# Patient Record
Sex: Male | Born: 1940 | Race: White | Hispanic: No | Marital: Single | Smoking: Never smoker
Health system: Southern US, Community
[De-identification: ages and names within clinical notes are randomized; demographics above are authoritative.]

---

## 2013-10-07 ENCOUNTER — Emergency Department (HOSPITAL_COMMUNITY): Payer: Medicare Other

## 2013-10-07 ENCOUNTER — Encounter (HOSPITAL_COMMUNITY): Payer: Self-pay | Admitting: Emergency Medicine

## 2013-10-07 ENCOUNTER — Emergency Department (HOSPITAL_COMMUNITY)
Admission: EM | Admit: 2013-10-07 | Discharge: 2013-10-07 | Disposition: A | Discharge status: Home / Self Care | Payer: Medicare Other | Attending: Emergency Medicine | Admitting: Emergency Medicine

## 2013-10-07 DIAGNOSIS — R32 Unspecified urinary incontinence: Secondary | ICD-10-CM | POA: Insufficient documentation

## 2013-10-07 DIAGNOSIS — K439 Ventral hernia without obstruction or gangrene: Secondary | ICD-10-CM | POA: Insufficient documentation

## 2013-10-07 DIAGNOSIS — W06XXXA Fall from bed, initial encounter: Secondary | ICD-10-CM | POA: Insufficient documentation

## 2013-10-07 DIAGNOSIS — R531 Weakness: Secondary | ICD-10-CM

## 2013-10-07 DIAGNOSIS — Y9389 Activity, other specified: Secondary | ICD-10-CM | POA: Insufficient documentation

## 2013-10-07 DIAGNOSIS — R5383 Other fatigue: Principal | ICD-10-CM

## 2013-10-07 DIAGNOSIS — W19XXXA Unspecified fall, initial encounter: Secondary | ICD-10-CM

## 2013-10-07 DIAGNOSIS — F0390 Unspecified dementia without behavioral disturbance: Secondary | ICD-10-CM | POA: Insufficient documentation

## 2013-10-07 DIAGNOSIS — IMO0002 Reserved for concepts with insufficient information to code with codable children: Secondary | ICD-10-CM | POA: Insufficient documentation

## 2013-10-07 DIAGNOSIS — Y929 Unspecified place or not applicable: Secondary | ICD-10-CM | POA: Insufficient documentation

## 2013-10-07 DIAGNOSIS — R5381 Other malaise: Secondary | ICD-10-CM | POA: Insufficient documentation

## 2013-10-07 LAB — URINE MICROSCOPIC-ADD ON

## 2013-10-07 LAB — CBC WITH DIFFERENTIAL/PLATELET
Basophils Absolute: 0 10*3/uL (ref 0.0–0.1)
Basophils Relative: 1 % (ref 0–1)
EOS ABS: 0.2 10*3/uL (ref 0.0–0.7)
Eosinophils Relative: 2 % (ref 0–5)
HCT: 37.9 % — ABNORMAL LOW (ref 39.0–52.0)
HEMOGLOBIN: 13.3 g/dL (ref 13.0–17.0)
LYMPHS PCT: 13 % (ref 12–46)
Lymphs Abs: 1 10*3/uL (ref 0.7–4.0)
MCH: 30 pg (ref 26.0–34.0)
MCHC: 35.1 g/dL (ref 30.0–36.0)
MCV: 85.4 fL (ref 78.0–100.0)
MONOS PCT: 7 % (ref 3–12)
Monocytes Absolute: 0.5 10*3/uL (ref 0.1–1.0)
NEUTROS PCT: 77 % (ref 43–77)
Neutro Abs: 5.7 10*3/uL (ref 1.7–7.7)
Platelets: 171 10*3/uL (ref 150–400)
RBC: 4.44 MIL/uL (ref 4.22–5.81)
RDW: 14.7 % (ref 11.5–15.5)
WBC: 7.3 10*3/uL (ref 4.0–10.5)

## 2013-10-07 LAB — COMPREHENSIVE METABOLIC PANEL
ALK PHOS: 77 U/L (ref 39–117)
ALT: 15 U/L (ref 0–53)
AST: 19 U/L (ref 0–37)
Albumin: 3.8 g/dL (ref 3.5–5.2)
BILIRUBIN TOTAL: 0.3 mg/dL (ref 0.3–1.2)
BUN: 22 mg/dL (ref 6–23)
CHLORIDE: 104 meq/L (ref 96–112)
CO2: 25 meq/L (ref 19–32)
Calcium: 9.2 mg/dL (ref 8.4–10.5)
Creatinine, Ser: 1 mg/dL (ref 0.50–1.35)
GFR, EST AFRICAN AMERICAN: 85 mL/min — AB (ref >90)
GFR, EST NON AFRICAN AMERICAN: 74 mL/min — AB (ref >90)
GLUCOSE: 199 mg/dL — AB (ref 70–99)
Potassium: 5.1 mEq/L (ref 3.7–5.3)
Sodium: 142 mEq/L (ref 137–147)
TOTAL PROTEIN: 7.1 g/dL (ref 6.0–8.3)

## 2013-10-07 LAB — URINALYSIS, ROUTINE W REFLEX MICROSCOPIC
BILIRUBIN URINE: NEGATIVE
Glucose, UA: NEGATIVE mg/dL
KETONES UR: NEGATIVE mg/dL
LEUKOCYTES UA: NEGATIVE
NITRITE: NEGATIVE
Protein, ur: 30 mg/dL — AB
Specific Gravity, Urine: 1.028 (ref 1.005–1.030)
UROBILINOGEN UA: 0.2 mg/dL (ref 0.0–1.0)
pH: 5.5 (ref 5.0–8.0)

## 2013-10-07 NOTE — Progress Notes (Signed)
CARE MANAGEMENT ED NOTE 10/08/2013  Patient:  Derek Murillo,Derek Murillo   Account Number:  [id number]  Date Initiated:  10/07/2013  Documentation initiated by:  [facility]OGERS,Dezmen Alcock  Subjective/Objective Assessment:   [facility] weakness and Fall     Subjective/Objective Assessment Detail:     Action/Plan:   increasing HH services to assist with home health needs   Action/Plan Detail:   Anticipated DC Date:  10/07/2013     Status Recommendation to Physician:   Result of Recommendation:  Agreed  Other ED Services  Consult Working Plan   In-house referral  Clinical Social Worker   DC Associate Professorlanning Services  CM consult   [facility]  HOME HEALTH   Choice offered to / List presented to:  C-3 Spouse     HH arranged  HH-1 Theatre stage managerN  HH-2 PT  HH-6 SOCIAL WORKER  HH-3 OT      [facility] agency  [facility]    Status of service:  Completed, signed off  ED Comments:   ED Comments Detail:  4/4/ 1240 W. Aundria RudRogers RN BSN NCM 336 [phone] Christiana FuchsContacted Allison [phone] at [facility] to follow up on referral. She states, [facility] will follow up with patient and family on Monday 4/6. Attempted to notify patient's family to follow up,  no answer VM not set up.  10/07/13 1343  W. Brazos Sandoval RN BSN NCM 336 [phone] V/S WNL, UA,CBC ,CMP unremarkable, CT head and spine no acute abnormalities. ED evaluation unremarkable.Plan to discharge home with continued HH orders. Offered choice wife states, [facility] are [facility] will continue. Discussed the Recommendation for RN,PT/OT and SW, wife agrees, E. West and Dr. Effie ShyWentz agrees with plan. Referral placed by fax [phone]. Fax confirmation received. Provided wife with my contact information  Should she have any further concerns or questions. Wife verbalizes understanding.  10/07/13 12:57 W. Aundria RudRogers RN BSN NCM 336 [phone] [facility] to [facility] [phone] left message for on-call person, for return call , maybe closed for Good  Friday holiday.  10/07/13 1223 W. Aundria RudRogers RN BSN NCM 336 [phone] Wife Chip BoerVicki arrived and is at bedside. ED NCM and ED CSW spoke with wife. She states, husband lives with son and is [facility]. She unaware of exact service but knows a nurse come out a couple of times per week. She confirms patient has equipment walker, bedside commode. She reports that her son is presently seeking placement.  ED CSW and NCM explained to wife that if patient is does not meet medical necessity for inpatient admission,he will be discharged to family, and they could initiate process of placement from home with working with their PCP.  She verbalized understanding, and is agrees with plan to take him home. Discussed plan with E.. West PA-C, she agrees with plan. Evaluation still pending.  10/07/13 1119 W. Love Chowning RN BSN [phone] ED CM received consult from E. ChadWest PA-C concerning [facility] at request of Family. Pt  [facility] and fall. As per EMS son reports patient has been weak, hasn't "been himself, is falling a lot States he can't take care of him anymore". Pt has hx of dementia and is presently oriented to self but does not know who is the Economistresident.  As per EMS [facility]  [facility]. ED CSW attempted  to contact family, non working numbers. EDP medical evaluation pending.

## 2013-10-07 NOTE — ED Provider Notes (Signed)
CSN: [id number]     Arrival date & time 10/07/13  [id number] History   First MD Initiated Contact with Patient 10/07/13 [phone]     Chief Complaint  Patient presents with  . Fall  . Weakness     (Consider location/radiation/quality/duration/timing/severity/associated sxs/prior Treatment) Patient is a 73 y.o. male presenting with fall and weakness. The history is provided by the EMS personnel and the patient.  Fall Associated symptoms include weakness.  Weakness Associated symptoms include weakness.   Patient with hx dementia brought in by EMS.  EMS states son reports patient has been weak, hasn't "been himself," is falling a lot.  States he can't take care of him anymore.  Also has had strong smelling urine and has been incontinent of urine.  Pt lives at home with sons.   [facility].   Pt denies any pain, denies SOB, vomiting, any symptoms.  States he feels fine.  States he fell because he got out of bed and was off balance.    Level V caveat for dementia.   Per family member, patient actually fell yesterday not today and son wanted patient reevaluated.    No past medical history on file. No past surgical history on file. No family history on file. History  Substance Use Topics  . Smoking status: Never Smoker   . Smokeless tobacco: Not on file  . Alcohol Use: Yes    Review of Systems  Unable to perform ROS: Dementia  Neurological: Positive for weakness.      Allergies  Review of patient's allergies indicates no known allergies.  Home Medications  No current outpatient prescriptions on file. Ht 5\' 7"  (1.702 m)  Wt 205 lb (92.987 kg)  BMI 32.10 kg/m2  SpO2 95% Physical Exam  Nursing note and vitals reviewed. Constitutional: He appears well-developed and well-nourished. No distress.  HENT:  Head: Normocephalic and atraumatic.  Eyes: Conjunctivae are normal.  Neck: Neck supple.  Cardiovascular: Normal rate and regular rhythm.    Pulmonary/Chest: Effort normal and breath sounds normal. No respiratory distress. He has no wheezes. He has no rales.  Abdominal: Soft. He exhibits no distension and no mass. There is no tenderness. There is no rigidity, no rebound and no guarding. A hernia is present. Hernia confirmed positive in the ventral area.  Neurological: He is alert. He exhibits normal muscle tone.  Moves all extremities equally.  Oriented to self.  Knows [place].  Does not know the date or the president.    Skin: He is not diaphoretic.  Bilateral knee abrasions.  No erythema, edema, discharge.     ED Course  Procedures (including critical care time) Labs Review Labs Reviewed  CBC WITH DIFFERENTIAL - Abnormal; Notable for the following:    HCT 37.9 (*)    All other components within normal limits  COMPREHENSIVE METABOLIC PANEL - Abnormal; Notable for the following:    Glucose, Bld 199 (*)    GFR calc non Af Amer 74 (*)    GFR calc Af Amer 85 (*)    All other components within normal limits  URINALYSIS, ROUTINE W REFLEX MICROSCOPIC - Abnormal; Notable for the following:    Hgb urine dipstick TRACE (*)    Protein, ur 30 (*)    All other components within normal limits  URINE MICROSCOPIC-ADD ON - Abnormal; Notable for the following:    Bacteria, UA FEW (*)    Casts HYALINE  CASTS (*)    All other components within normal limits   Imaging Review Dg Chest 2 View  10/07/2013   CLINICAL DATA:  Larey Seat  EXAM: CHEST - 2 VIEW  COMPARISON:  10/06/2013 and earlier studies  FINDINGS: Peribronchial thickening and interstitial prominence in the right infrahilar region as before. Left lung clear. Heart size normal. . No effusion. Visualized skeletal structures are unremarkable.  IMPRESSION: Stable chronic right infrahilar peribronchial disease. No acute abnormality   Electronically Signed   By: Oley Balm M.D.   On: 10/07/2013 10:37   Ct Head Wo Contrast  10/07/2013   CLINICAL DATA:  Larey Seat   EXAM: CT HEAD WITHOUT CONTRAST  CT CERVICAL SPINE WITHOUT CONTRAST  TECHNIQUE: Multidetector CT imaging of the head and cervical spine was performed following the standard protocol without intravenous contrast. Multiplanar CT image reconstructions of the cervical spine were also generated.  COMPARISON:  04/16/2012  FINDINGS: CT HEAD FINDINGS  Atherosclerotic and physiologic intracranial calcifications. Previous left frontal and parietal craniotomy defects. Stable small left thalamic lacunar infarct. Diffuse parenchymal atrophy. Patchy areas of hypoattenuation in deep and periventricular white matter bilaterally. Negative for acute intracranial hemorrhage, mass lesion, acute infarction, midline shift, or mass-effect. Acute infarct may be inapparent on noncontrast CT. Mild asymmetry of lateral ventricles left greater than right as before. Bone windows demonstrate no acute lesion.  CT CERVICAL SPINE FINDINGS  Normal alignment. No prevertebral soft tissue swelling. Mild narrowing of interspaces C3-T1 with small anterior and posterior endplate spurs at all levels. Facets are seated. Negative for fracture. Visualized lung apices clear. Bilateral calcified carotid bifurcation plaque.  IMPRESSION: 1. Negative for bleed or other acute intracranial process. 2. Negative for cervical fracture or dislocation. 3. Postop changes, parenchymal atrophy, and nonspecific white matter changes as before. 4. Degenerative disc disease C3-T1.   Electronically Signed   By: Oley Balm M.D.   On: 10/07/2013 11:08   Ct Cervical Spine Wo Contrast  10/07/2013   CLINICAL DATA:  Larey Seat  EXAM: CT HEAD WITHOUT CONTRAST  CT CERVICAL SPINE WITHOUT CONTRAST  TECHNIQUE: Multidetector CT imaging of the head and cervical spine was performed following the standard protocol without intravenous contrast. Multiplanar CT image reconstructions of the cervical spine were also generated.  COMPARISON:  04/16/2012  FINDINGS: CT HEAD FINDINGS  Atherosclerotic  and physiologic intracranial calcifications. Previous left frontal and parietal craniotomy defects. Stable small left thalamic lacunar infarct. Diffuse parenchymal atrophy. Patchy areas of hypoattenuation in deep and periventricular white matter bilaterally. Negative for acute intracranial hemorrhage, mass lesion, acute infarction, midline shift, or mass-effect. Acute infarct may be inapparent on noncontrast CT. Mild asymmetry of lateral ventricles left greater than right as before. Bone windows demonstrate no acute lesion.  CT CERVICAL SPINE FINDINGS  Normal alignment. No prevertebral soft tissue swelling. Mild narrowing of interspaces C3-T1 with small anterior and posterior endplate spurs at all levels. Facets are seated. Negative for fracture. Visualized lung apices clear. Bilateral calcified carotid bifurcation plaque.  IMPRESSION: 1. Negative for bleed or other acute intracranial process. 2. Negative for cervical fracture or dislocation. 3. Postop changes, parenchymal atrophy, and nonspecific white matter changes as before. 4. Degenerative disc disease C3-T1.   Electronically Signed   By: Oley Balm M.D.   On: 10/07/2013 11:08     EKG Interpretation   Date/Time:  Friday October 07 2013 09:51:21 EDT Ventricular Rate:  80 PR Interval:  143 QRS Duration: 89 QT Interval:  388 QTC Calculation: 448 R Axis:   62 Text  Interpretation:  Sinus rhythm Low voltage, extremity leads Baseline  wander in lead(s) II III aVF No old tracing to compare Confirmed by [facility]   MD, ELLIOTT [phone]) on 10/07/2013 10:21:55 AM      10:24 AM Dr Effie Shy made aware of the patient.  I have also asked the secretary to attempt to get records from [facility] as we have no medical history available to [place] and family is currently not present.    10:41 AM Discussed pt with Care Manager  11:33 AM Notes reviewed from [facility].  PMH CVA, dementia (alzheimers), HTN, CHF, MI, hyperlipidemia, COPD, kidney stones, DM.   [facility] notes that patient was evaluated for fall at home, son wanted patient to be admitted so that his transition to [facility] would be paid for (otherwise would be $8,000), patient did not meet criteria for admission to hospital on this ED visit 10/06/13.   12:07 PM Son's mother is now bedside, I discussed all results with her and she agrees with d/c home with home health.  Appears pt has minimal home health at present, case management working to see what else we can offer patient.  Pt has wheelchair and walker at this time.   Case managers Burna Mortimer and Tyler Aas have worked with family member to provide assistance needed at home.    Filed Vitals:   10/07/13 1351  BP: 126/57  Pulse: 70  Temp: 98.5 F (36.9 C)  Resp: 16     MDM   Final diagnoses:  Generalized weakness  Fall    Pt with dementia brought in from home for generalized weakness, multiple recent falls. Evaluated at [facility] yesterday for fall, family wanted patient to be reevaluated here.  No criteria for admission.  Pt has wounds on his knees that do not appear infected, were addressed yesterday by [facility].  Pt seen by case managers who were able to help increase home health for the patient.  D/C home.  PCP follow up as needed.  Home Health will include RN, PT, OT, SW.      Trixie Dredge, PA-C 10/07/13 1513

## 2013-10-07 NOTE — Discharge Instructions (Signed)
Read the information below.  You may return to the Emergency Department at any time for worsening condition or any new symptoms that concern you.     Fall Prevention and Home Safety Falls cause injuries and can affect all age groups. It is possible to use preventive measures to significantly decrease the likelihood of falls. There are many simple measures which can make your home safer and prevent falls. OUTDOORS  Repair cracks and edges of walkways and driveways.  Remove high doorway thresholds.  Trim shrubbery on the main path into your home.  Have good outside lighting.  Clear walkways of tools, rocks, debris, and clutter.  Check that handrails are not broken and are securely fastened. Both sides of steps should have handrails.  Have leaves, snow, and ice cleared regularly.  Use sand or salt on walkways during winter months.  In the garage, clean up grease or oil spills. BATHROOM  Install night lights.  Install grab bars by the toilet and in the tub and shower.  Use non-skid mats or decals in the tub or shower.  Place a plastic non-slip stool in the shower to sit on, if needed.  Keep floors dry and clean up all water on the floor immediately.  Remove soap buildup in the tub or shower on a regular basis.  Secure bath mats with non-slip, double-sided rug tape.  Remove throw rugs and tripping hazards from the floors. BEDROOMS  Install night lights.  Make sure a bedside light is easy to reach.  Do not use oversized bedding.  Keep a telephone by your bedside.  Have a firm chair with side arms to use for getting dressed.  Remove throw rugs and tripping hazards from the floor. KITCHEN  Keep handles on pots and pans turned toward the center of the stove. Use back burners when possible.  Clean up spills quickly and allow time for drying.  Avoid walking on wet floors.  Avoid hot utensils and knives.  Position shelves so they are not too high or low.  Place  commonly used objects within easy reach.  If necessary, use a sturdy step stool with a grab bar when reaching.  Keep electrical cables out of the way.  Do not use floor polish or wax that makes floors slippery. If you must use wax, use non-skid floor wax.  Remove throw rugs and tripping hazards from the floor. STAIRWAYS  Never leave objects on stairs.  Place handrails on both sides of stairways and use them. Fix any loose handrails. Make sure handrails on both sides of the stairways are as long as the stairs.  Check carpeting to make sure it is firmly attached along stairs. Make repairs to worn or loose carpet promptly.  Avoid placing throw rugs at the top or bottom of stairways, or properly secure the rug with carpet tape to prevent slippage. Get rid of throw rugs, if possible.  Have an electrician put in a light switch at the top and bottom of the stairs. OTHER FALL PREVENTION TIPS  Wear low-heel or rubber-soled shoes that are supportive and fit well. Wear closed toe shoes.  When using a stepladder, make sure it is fully opened and both spreaders are firmly locked. Do not climb a closed stepladder.  Add color or contrast paint or tape to grab bars and handrails in your home. Place contrasting color strips on first and last steps.  Learn and use mobility aids as needed. Install an electrical emergency response system.  Turn on lights  to avoid dark areas. Replace light bulbs that burn out immediately. Get light switches that glow.  Arrange furniture to create clear pathways. Keep furniture in the same place.  Firmly attach carpet with non-skid or double-sided tape.  Eliminate uneven floor surfaces.  Select a carpet pattern that does not visually hide the edge of steps.  Be aware of all pets. OTHER HOME SAFETY TIPS  Set the water temperature for 120 F (48.8 C).  Keep emergency numbers on or near the telephone.  Keep smoke detectors on every level of the home and near  sleeping areas. Document Released: 06/13/2002 Document Revised: 12/23/2011 Document Reviewed: 09/12/2011 [facility] Patient Information 2014 [place] [place].

## 2013-10-07 NOTE — ED Notes (Signed)
Received pt from home with c/o pt fell this morning getting out of bed. Per EMS son reports that pt has had multiple falls and thinks pt may have UTI. Pt incontinent with strong odor in urine. Pt seen at another facility earlier this week for same. Pt present with wounds to B/L knees and has been seen by another Dr for them.

## 2013-10-07 NOTE — ED Provider Notes (Signed)
  Face-to-face evaluation   History: Elderly, male, who states he fell getting out of bed this morning. He complains only of injury to his knees. He has apparently had other falls, and was evaluated for same yesterday at an outlying facility.  Physical exam: Elderly man in, obese, cooperative, and mild pain. Normal active and passive range of motion of arms and legs, bilaterally. Minor abrasions anterior knees bilaterally.  Medical screening examination/treatment/procedure(s) were conducted as a shared visit with non-physician practitioner(s) and myself.  I personally evaluated the patient during the encounter  Flint MelterElliott L Leanna Hamid, MD 10/07/13 1600

## 2013-10-07 NOTE — ED Notes (Signed)
Patient transported to X-ray 

## 2013-10-10 NOTE — Progress Notes (Addendum)
  10/10/13 1206 W. Georgia Baria RN BSN 336 [phone] Marice PotterContacted [name] from [facility] [phone] she states, she is unable to provide [facility] needs at this time.     10/10/13 10:30 W. Aundria RudRogers RN BSN [phone] ED CM received VM this morning from [name] at [facility] [phone] unable to take this case for [facility] needs.  Attempted to (son) [name] at [phone] unable to LVM also attempted to reach wife [name] at [phone] left VM to return my call. ED CM will continue to contact family to offer choice of HH agency.

## 2013-10-11 NOTE — Progress Notes (Addendum)
Placed out going call to Derek Murillo ( patients son) [phone].Role of CM explained.CM reported to Derek Murillo that she  Attempted to call him yesterday but voice mail option was not set up.Explained that CM department had received a call [facility] were unable to provide home health services.Son reports he would like Home health set up with another agency.CHOICE List provided over the phone.Derek Murillo [facility].CM reports she will set up services.

## 2013-10-11 NOTE — Progress Notes (Signed)
VM left for lee patients son to confirm best contact name / number for patients [facility] referral CM contact number provided.

## 2015-01-02 IMAGING — CT CT HEAD W/O CM
4 of 6 series · 17 of 47 positions shown, 19 images · non-contrast
Comparison: 04/16/2012

CLINICAL DATA: Fell

EXAM:
CT HEAD WITHOUT CONTRAST
CT CERVICAL SPINE WITHOUT CONTRAST
TECHNIQUE: Multidetector CT imaging of the head and cervical spine was
performed following the standard protocol without intravenous
contrast. Multiplanar CT image reconstructions of the cervical spine
were also generated.

[Series 3: head w/o bone · axial · non-contrast · 0.49mm/px · z∈[+230,+308]mm · 3 of 63 slices shown]
[im 16/63  bone]
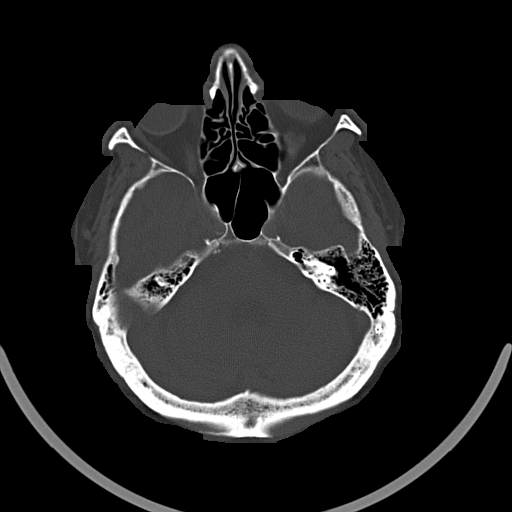
[im 32/63  bone]
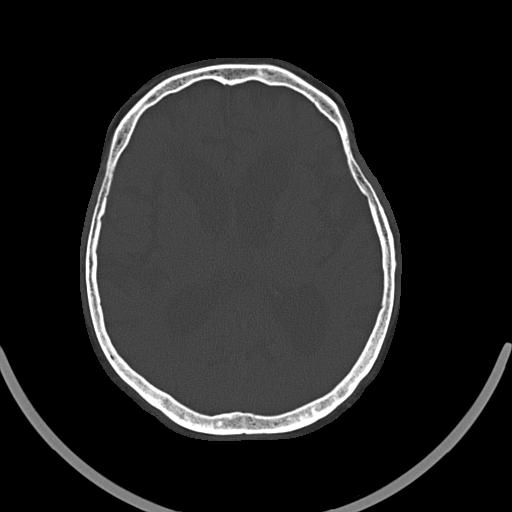
[im 47/63  bone]
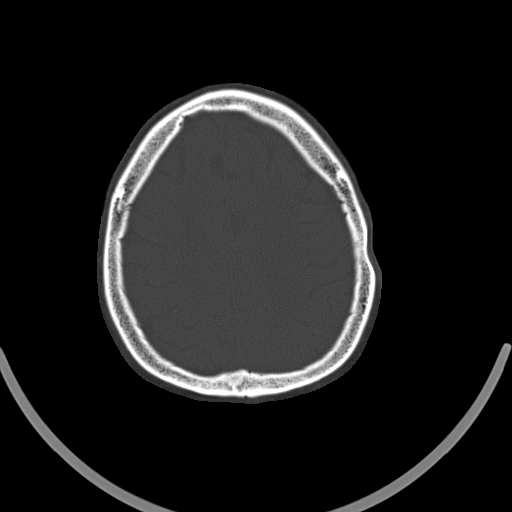

[Series 5: soft tissue · axial · 0.39mm/px · z∈[+34,+216]mm · 8 of 117 slices shown, 10 images]
[im 13/117  brain]
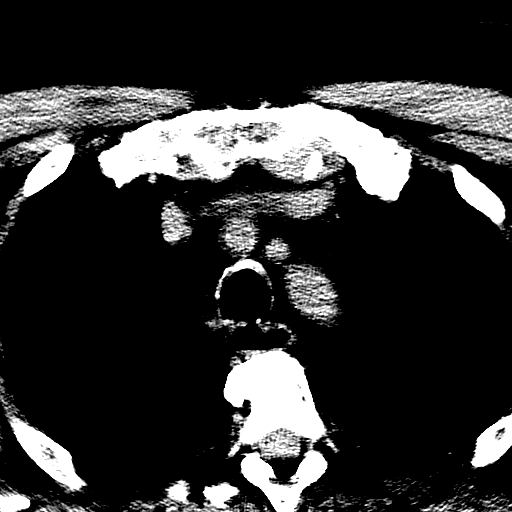
[im 13/117  bone]
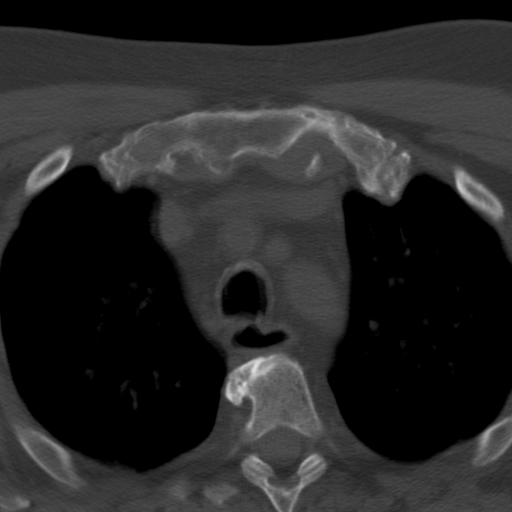
[im 26/117  brain]
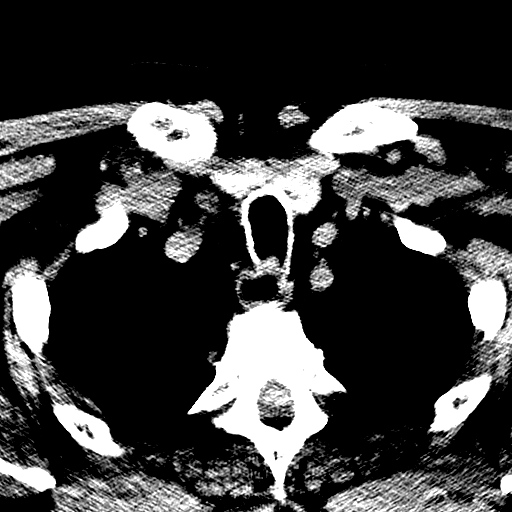
[im 39/117  brain]
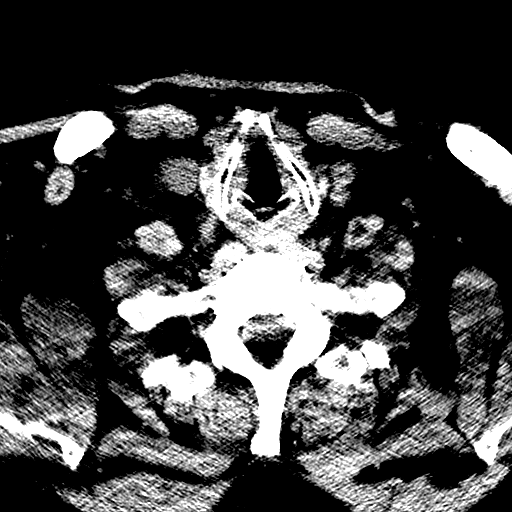
[im 52/117  brain]
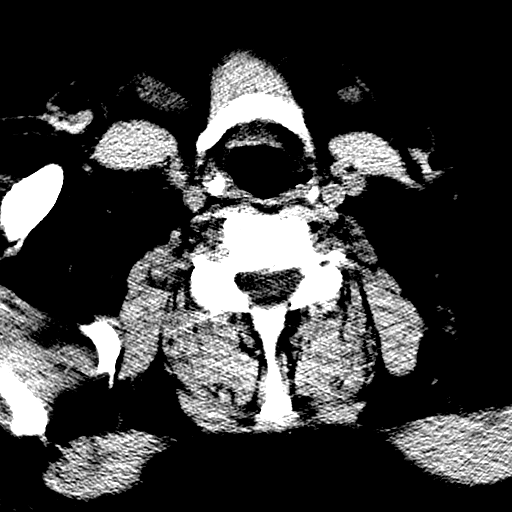
[im 65/117  brain]
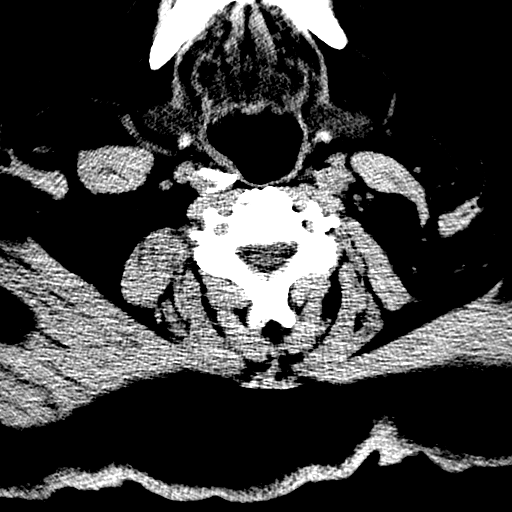
[im 65/117  bone]
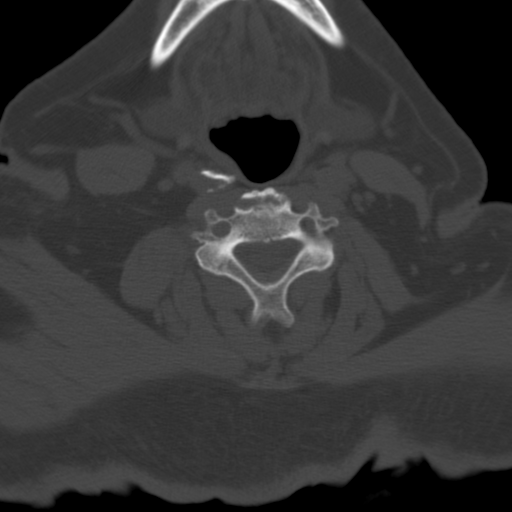
[im 78/117  brain]
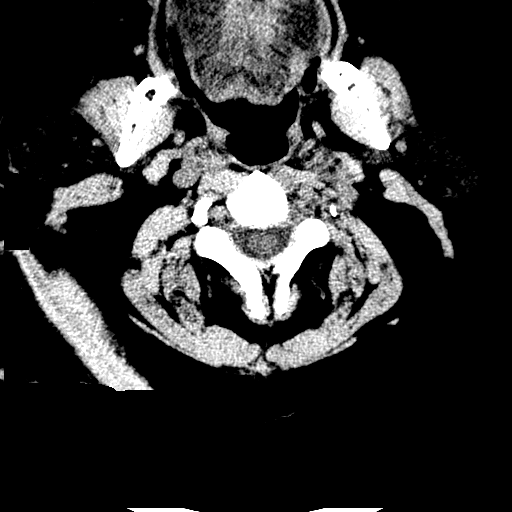
[im 91/117  brain]
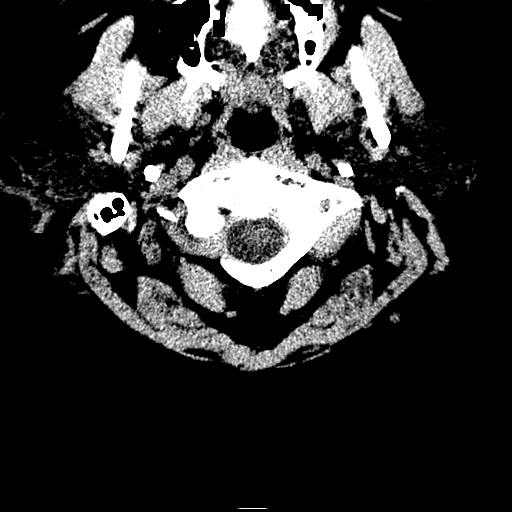
[im 104/117  brain]
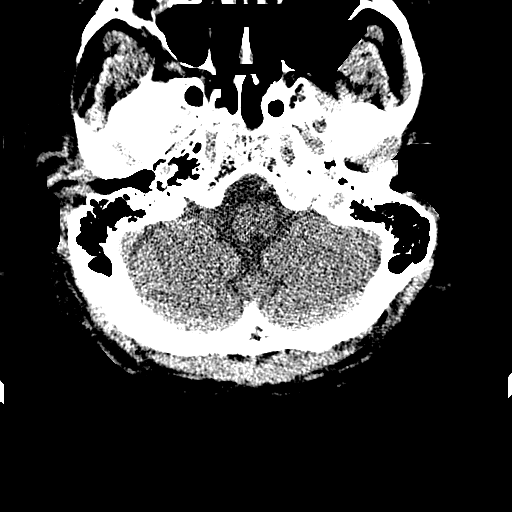

[mpr, sagittal, sagittal · sagittal · 0.45mm/px · 3 of 100 slices shown]
[im 34/100  brain]
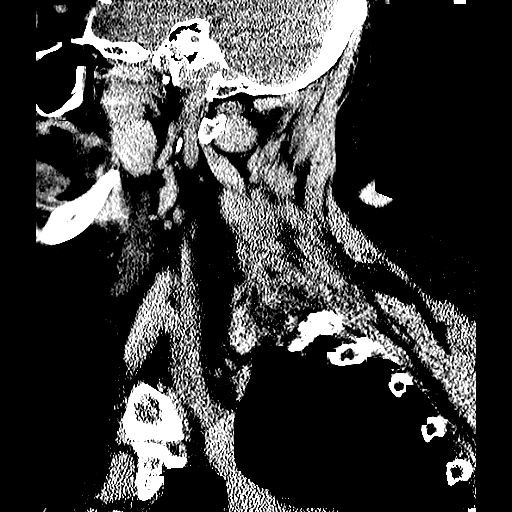
[im 50/100  brain]
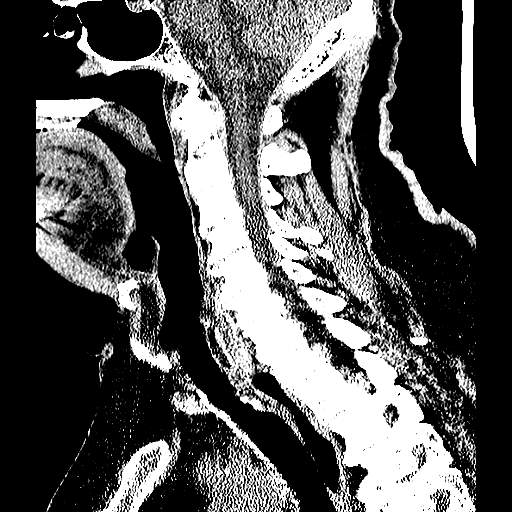
[im 67/100  brain]
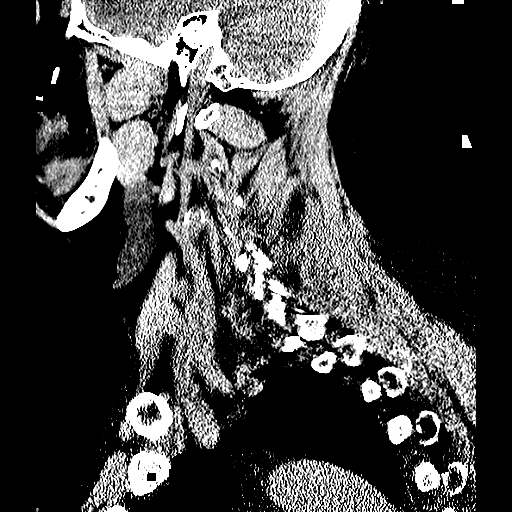

[coronal · coronal · 0.45mm/px · 3 of 101 slices shown]
[im 34/101  brain]
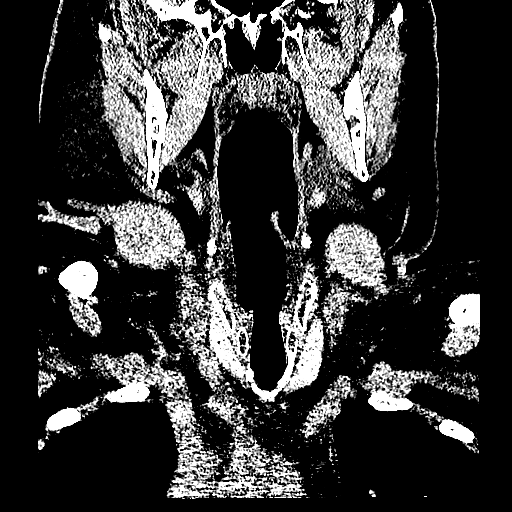
[im 45/101  brain]
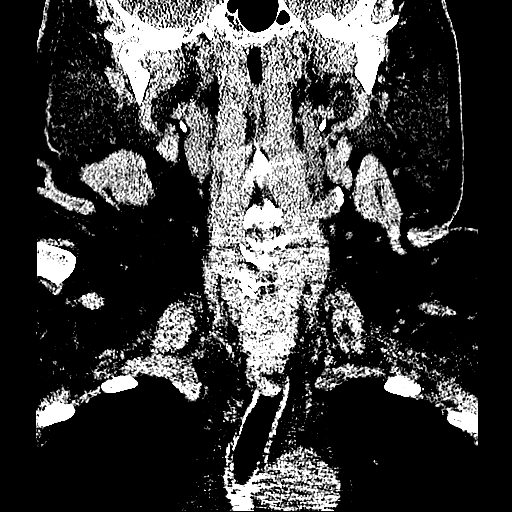
[im 56/101  brain]
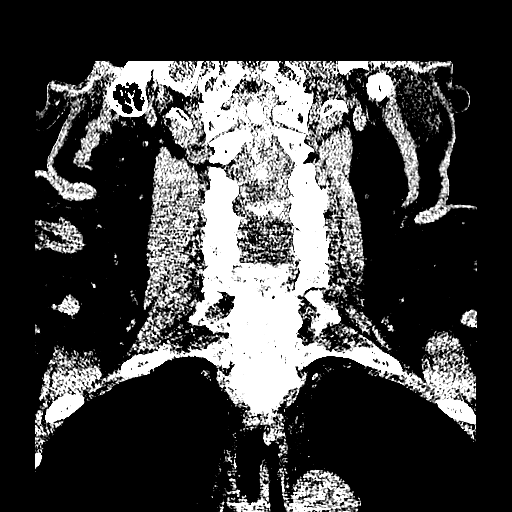

[17 of 47 positions shown; findings below may reference images not displayed]

FINDINGS: CT HEAD FINDINGS

Atherosclerotic and physiologic intracranial calcifications.
Previous left frontal and parietal craniotomy defects. Stable small
left thalamic lacunar infarct. Diffuse parenchymal atrophy. Patchy
areas of hypoattenuation in deep and periventricular white matter
bilaterally. Negative for acute intracranial hemorrhage, mass
lesion, acute infarction, midline shift, or mass-effect. Acute
infarct may be inapparent on noncontrast CT. Mild asymmetry of
lateral ventricles left greater than right as before. Bone windows
demonstrate no acute lesion.

CT CERVICAL SPINE FINDINGS

Normal alignment. No prevertebral soft tissue swelling. Mild
narrowing of interspaces C3-T1 with small anterior and posterior
endplate spurs at all levels. Facets are seated. Negative for
fracture. Visualized lung apices clear. Bilateral calcified carotid
bifurcation plaque.
IMPRESSION: 1. Negative for bleed or other acute intracranial process.
2. Negative for cervical fracture or dislocation.
3. Postop changes, parenchymal atrophy, and nonspecific white matter
changes as before.
4. Degenerative disc disease C3-T1.

## 2015-05-08 DEATH — deceased
